# Patient Record
Sex: Female | Born: 2000 | Race: White | Hispanic: No | Marital: Single | Smoking: Never smoker
Health system: Southern US, Community
[De-identification: ages and names within clinical notes are randomized; demographics above are authoritative.]

## PROBLEM LIST (undated history)

## (undated) HISTORY — PX: NO PAST SURGERIES: SHX2092

---
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
## 2021-08-11 IMAGING — RF DG ESOPHAGUS
14 of 18 series · 16 of 24 positions shown · non-contrast
Comparison: None

CLINICAL DATA: Dysphagia, hoarseness and difficulty swallowing
foods.

EXAM:
ESOPHOGRAM / BARIUM SWALLOW / BARIUM TABLET STUDY
TECHNIQUE: Combined double contrast and single contrast examination performed
using effervescent crystals, thick barium liquid, and thin barium
liquid. The patient was observed with fluoroscopy swallowing a 13 mm
barium sulphate tablet.
FLUOROSCOPY TIME:  Fluoroscopy Time:  2 minutes 42 seconds
Radiation Exposure Index (if provided by the fluoroscopic device):
12.1
Number of Acquired Spot Images: 0

[Series 1: cp_standard · 0.34mm/px · 1 of 9 frames shown (1 of 14)]
[frame 2/9]
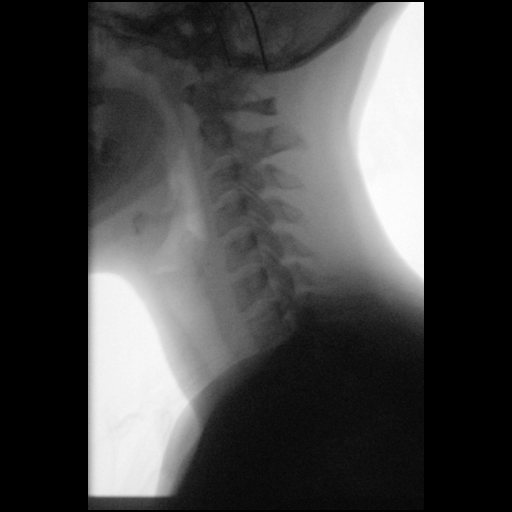

[Series 2: cp_standard · 0.34mm/px · 1 of 19 frames shown (2 of 14)]
[frame 19/19]
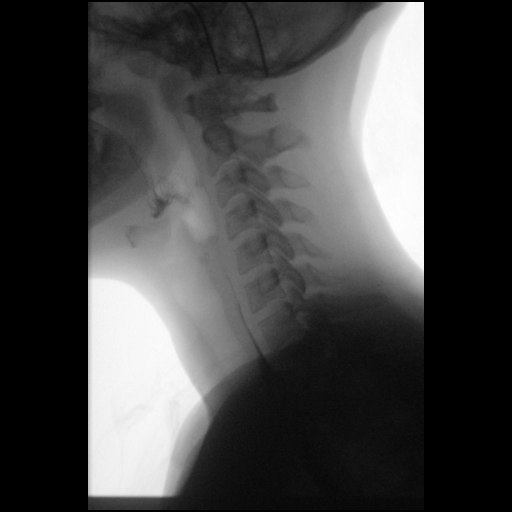

[Series 3: cp_standard · 0.39mm/px · 1 of 6 frames shown (3 of 14)]
[frame 4/6]
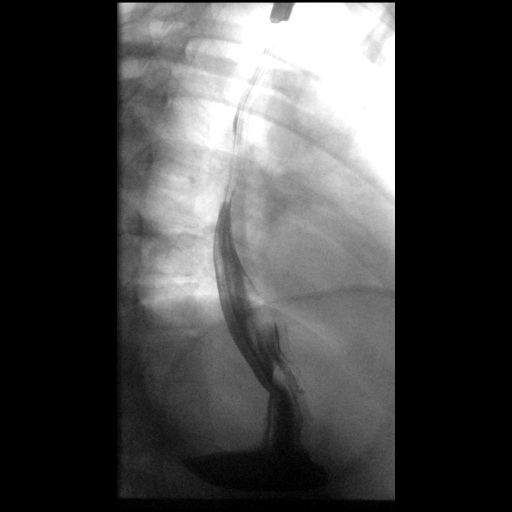

[Series 5: cp_standard · 0.39mm/px · 2 of 15 frames shown (4 of 14)]
[frame 3/15]
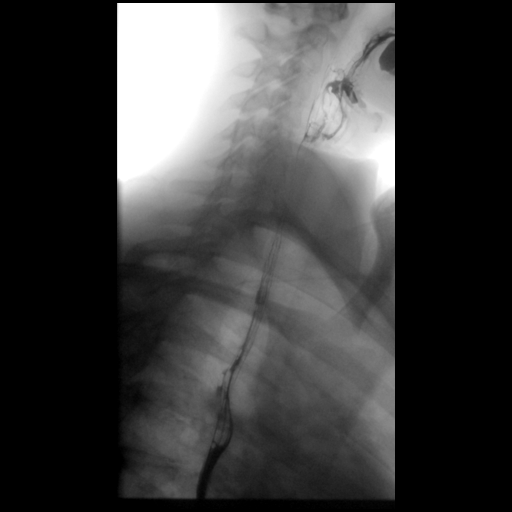
[frame 15/15]
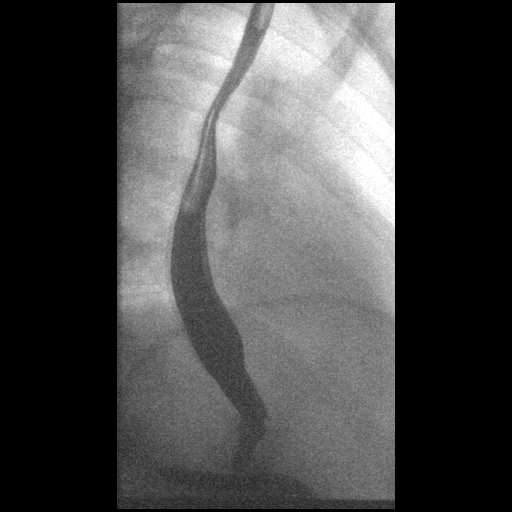

[Series 9: cp_standard · 0.40mm/px · 1 of 3 frames shown (5 of 14)]
[frame 2/3]
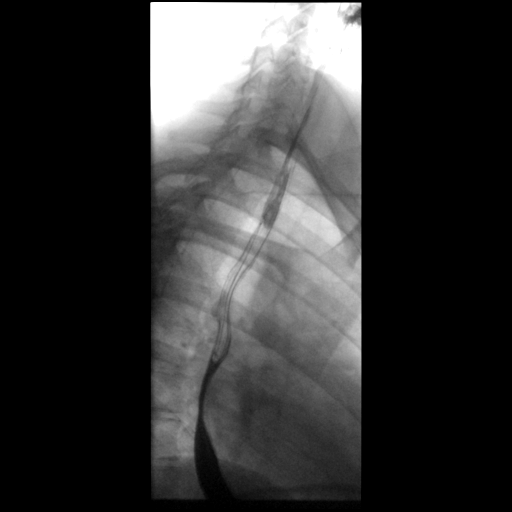

[Series 10: cp_standard · 0.40mm/px · 1 of 19 frames shown (6 of 14)]
[frame 10/19]
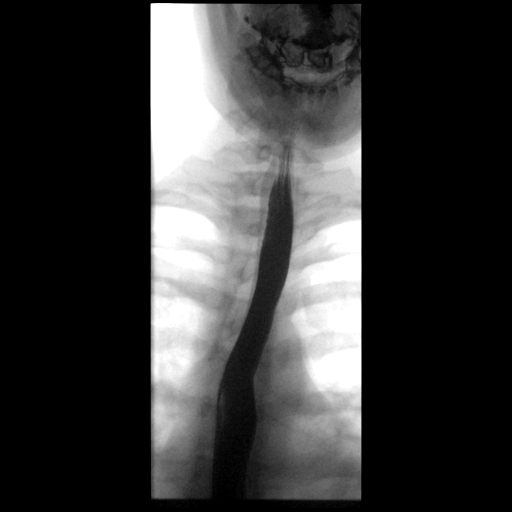

[Series 13: cp_standard · 0.41mm/px · 1 of 21 frames shown (7 of 14)]
[frame 11/21]
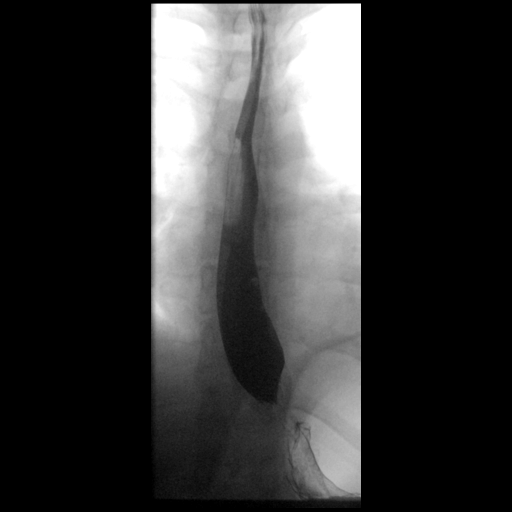

[Series 14: cp_standard · 0.41mm/px · 1 of 14 frames shown (8 of 14)]
[frame 1/14]
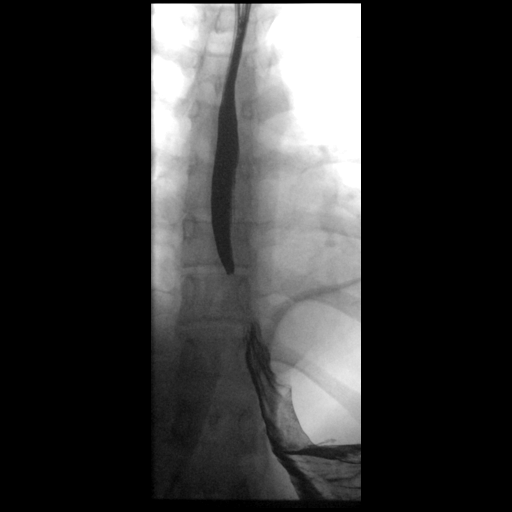

[Series 15: cp_standard · 0.41mm/px · 1 of 6 frames shown (9 of 14)]
[frame 6/6]
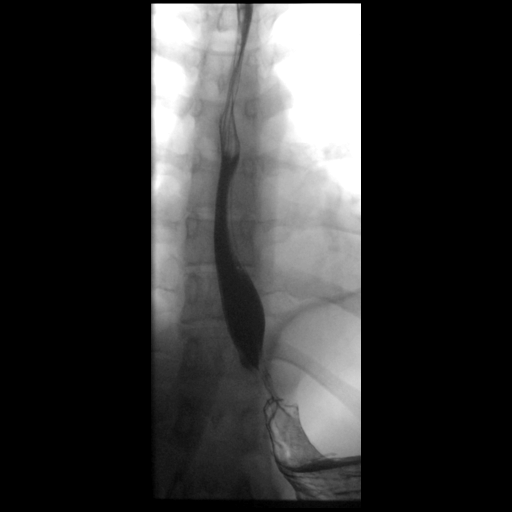

[Series 18: cp_standard · 0.55mm/px · 1 of 16 frames shown (10 of 14)]
[frame 3/16]
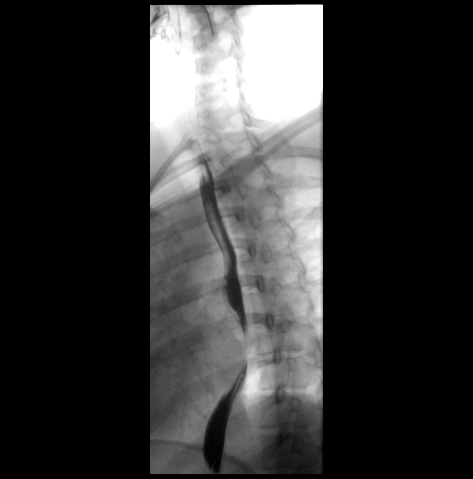

[Series 19: cp_standard · 0.55mm/px · 1 of 4 frames shown (11 of 14)]
[frame 3/4]
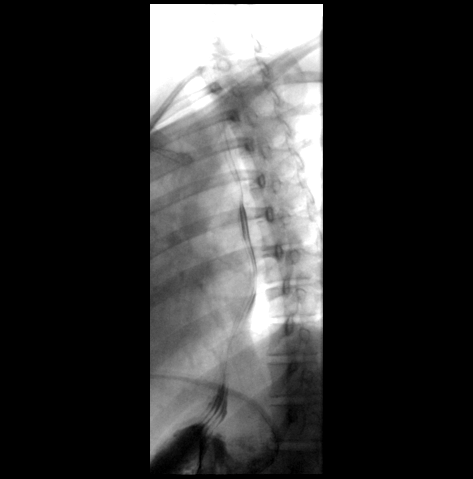

[Series 20: cp_standard · 0.55mm/px · 1 of 14 frames shown (12 of 14)]
[frame 8/14]
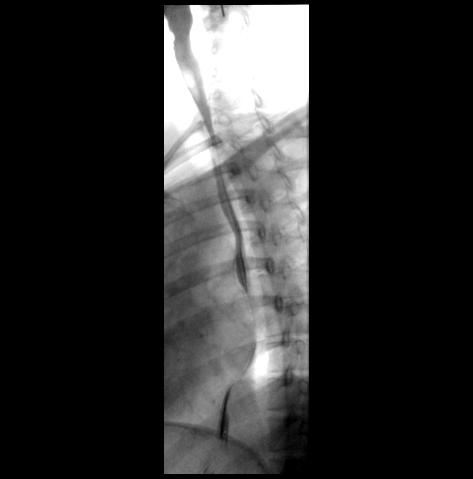

[Series 22: cp_standard · 0.57mm/px · 2 of 4 frames shown (13 of 14)]
[frame 1/4]
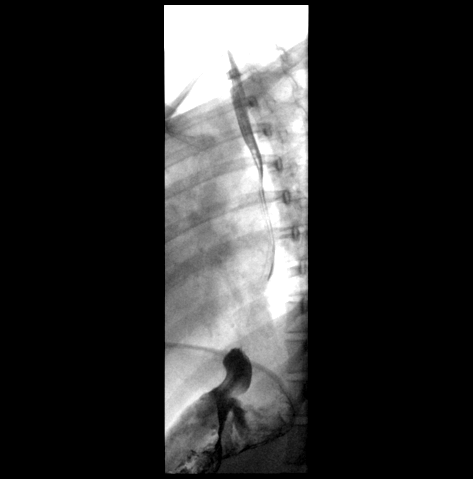
[frame 3/4]
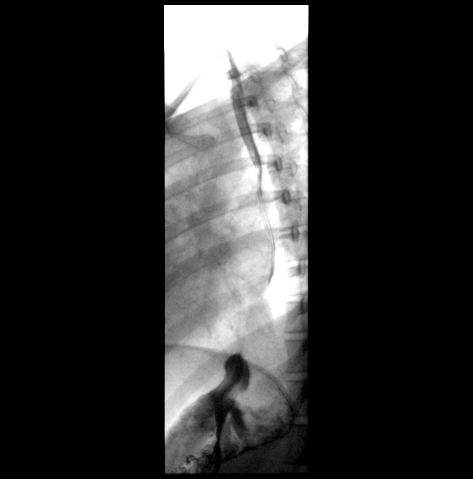

[Series 24: cp_standard · 0.57mm/px · 1 of 10 frames shown (14 of 14)]
[frame 9/10]
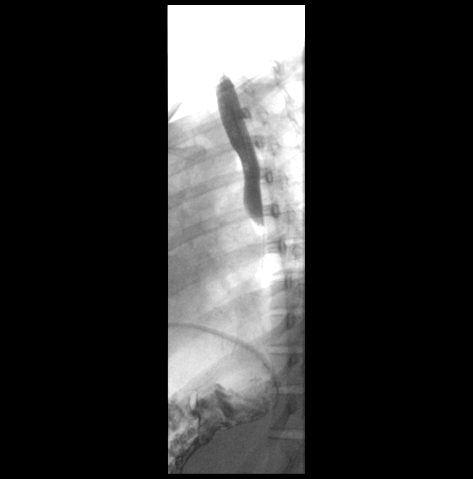

[16 of 24 positions shown; findings below may reference images not displayed]

FINDINGS: Esophagus with normal distensibility and caliber. No sign of fold
thickening. Very small hiatal hernia. No visible reflux.

Barium tablet passed without difficulty through the GE junction.
IMPRESSION: Very small hiatal hernia, otherwise unremarkable esophagram.
